# Patient Record
Sex: Female | Born: 1980 | Race: White | Hispanic: No | Marital: Single | Smoking: Never smoker
Health system: Southern US, Community
[De-identification: ages and names within clinical notes are randomized; demographics above are authoritative.]

## PROBLEM LIST (undated history)

## (undated) HISTORY — PX: APPENDECTOMY: SHX54

---

## 2005-01-26 ENCOUNTER — Ambulatory Visit: Payer: Self-pay | Admitting: Internal Medicine

## 2009-12-13 ENCOUNTER — Observation Stay: Payer: Self-pay | Admitting: Surgery

## 2009-12-13 ENCOUNTER — Ambulatory Visit: Payer: Self-pay | Admitting: Family Medicine

## 2015-03-17 ENCOUNTER — Encounter: Payer: Self-pay | Admitting: Emergency Medicine

## 2015-03-17 DIAGNOSIS — L03012 Cellulitis of left finger: Secondary | ICD-10-CM | POA: Diagnosis not present

## 2015-03-17 DIAGNOSIS — R2232 Localized swelling, mass and lump, left upper limb: Secondary | ICD-10-CM | POA: Diagnosis present

## 2015-03-17 DIAGNOSIS — L03111 Cellulitis of right axilla: Secondary | ICD-10-CM | POA: Diagnosis not present

## 2015-03-17 NOTE — ED Notes (Signed)
Pt presents to ER alert and in NAD. Pt states she has bilat third digit nail infections, pt states she is currently being treated for it x 5 days.

## 2015-03-18 ENCOUNTER — Emergency Department
Admission: EM | Admit: 2015-03-18 | Discharge: 2015-03-18 | Disposition: A | Discharge status: Home / Self Care | Payer: No Typology Code available for payment source | Attending: Emergency Medicine | Admitting: Emergency Medicine

## 2015-03-18 DIAGNOSIS — L03011 Cellulitis of right finger: Secondary | ICD-10-CM

## 2015-03-18 DIAGNOSIS — L03012 Cellulitis of left finger: Secondary | ICD-10-CM

## 2015-03-18 MED ORDER — CEPHALEXIN 500 MG PO CAPS
500.0000 mg | ORAL_CAPSULE | Freq: Once | ORAL | Status: AC
  Administered 2015-03-18: 500 mg via ORAL

## 2015-03-18 MED ORDER — CEPHALEXIN 500 MG PO CAPS
ORAL_CAPSULE | ORAL | Status: AC
  Administered 2015-03-18: 500 mg via ORAL
  Filled 2015-03-18: qty 1

## 2015-03-18 MED ORDER — CEPHALEXIN 500 MG PO CAPS: 500.0000 mg | ORAL_CAPSULE | Freq: Four times a day (QID) | ORAL | Status: DC

## 2015-03-18 NOTE — ED Notes (Signed)
Pt has redness, swelling and blister on her 3rd middle finger on both arm. Stated being Accutane for skin infection. Stated her PCP told her to come to ER for an antibiotic injection. Stated she is staring a new job in the morning.

## 2015-03-18 NOTE — ED Provider Notes (Signed)
[facility] Emergency Department Provider Note  ____________________________________________  Time seen: 1:15  I have reviewed the triage vital signs and the nursing notes.   HISTORY  Chief Complaint Nail Problem     HPI Rita Sandoval is a 34 y.o. female who presents with redness and swelling to the ulna side of both third finger nail beds.  She has spoken with her physician about this. The patient is on Accutane due to some issues with her back. They think the Accutane may be drying out her cuticles and making her more prone to paronychia. She has been using salt water warm soaks at home without much benefit. The pain is moderate to severe at times.  She does have a minor amount of discharge coming from the inflamed erythematous areas bilaterally.  Her primary physician sent her into the emergency department with the assessment ingestion of getting an antibiotic injection.  Patient is eager for a quick resolution of the symptoms as she returns to work tomorrow for the first time.     History reviewed. No pertinent past medical history.  There are no active problems to display for this patient.   Past Surgical History  Procedure Laterality Date  . Appendectomy      Current Outpatient Rx  Name  Route  Sig  Dispense  Refill  . cephALEXin (KEFLEX) 500 MG capsule   Oral   Take 1 capsule (500 mg total) by mouth 4 (four) times daily.   28 capsule   0     Allergies Monistat  History reviewed. No pertinent family history.  Social History History  Substance Use Topics  . Smoking status: Never Smoker   . Smokeless tobacco: Not on file  . Alcohol Use: Yes    Review of Systems  Constitutional: Negative for fever. ENT: Negative for sore throat. Cardiovascular: Negative for chest pain. Respiratory: Negative for shortness of breath. Gastrointestinal: Negative for abdominal pain, vomiting and diarrhea. Genitourinary: Negative for  dysuria. Musculoskeletal: Negative for back pain. Skin: Paronychia, bilateral third fingers. See history of present illness. Neurological: Negative for headaches   10-point ROS otherwise negative.  ____________________________________________   PHYSICAL EXAM:  VITAL SIGNS: ED Triage Vitals  Enc Vitals Group     BP 03/17/15 2257 141/76 mmHg     Pulse Rate 03/17/15 2257 58     Resp 03/17/15 2257 20     Temp 03/17/15 2257 98 F (36.7 C)     Temp Source 03/17/15 2257 Oral     SpO2 03/17/15 2257 99 %     Weight 03/17/15 2257 140 lb (63.504 kg)     Height 03/17/15 2257 5\' 2"  (1.575 m)     Head Cir --      Peak Flow --      Pain Score 03/17/15 2258 7     Pain Loc --      Pain Edu? --      Excl. in [place]? --     Constitutional:  Alert and oriented. Well appearing and in no distress. Cardiovascular: Normal rate, regular rhythm. Respiratory: Normal respiratory effort without tachypnea. Breath sounds are clear and equal bilaterally. No wheezes/rales/rhonchi. Gastrointestinal: Soft and nontender. No distention.  Back: No muscle spasm, no tenderness, no CVA tenderness. Musculoskeletal: Nontender with normal range of motion in all extremities.  No noted edema. Neurologic:  Normal speech and language. No gross focal neurologic deficits are appreciated.  Skin:  Redness and swelling with minor discharge from the ulnar, medial, edges  of the nailbeds on both #3 fingers. The discharge is minimal with some crustiness. There is no active discharge on palpation or examination. There is no erythema extending away from the nailbed. The remainder of the fingers or hands are uninvolved.  Psychiatric: Mood and affect are normal. Speech and behavior are normal.  ____________________________________________     INITIAL IMPRESSION / ASSESSMENT AND PLAN / ED COURSE  The patient has moderate though routine paronychia on both #3 fingers. I do not see any indication for IV or IM antibiotics. She has not  any oral antibiotics at the moment. She is not allergic to any medications. She is not breast-feeding currently. I am going to prescribe Keflex for her. She will continue with her warm water salt he soaks and allow any discharge as tolerated. She has an appointment to see her dermatologist in 5 days. Acting this is a good follow-up plan both in terms of specialty as well as in timing.  ____________________________________________   FINAL CLINICAL IMPRESSION(S) / ED DIAGNOSES  Final diagnoses:  Paronychia of finger, left  Paronychia of finger of right hand      Ahmed Prima, MD 03/18/15 [phone]

## 2015-03-18 NOTE — Discharge Instructions (Signed)

## 2019-01-11 ENCOUNTER — Telehealth: Payer: Self-pay | Admitting: Occupational Therapy

## 2019-01-11 NOTE — Telephone Encounter (Signed)
Documentation error.  No telephone call placed.

## 2019-09-05 ENCOUNTER — Other Ambulatory Visit: Payer: Self-pay

## 2019-09-05 DIAGNOSIS — Z20822 Contact with and (suspected) exposure to covid-19: Secondary | ICD-10-CM

## 2019-09-06 LAB — NOVEL CORONAVIRUS, NAA: SARS-CoV-2, NAA: DETECTED — AB

## 2020-05-30 ENCOUNTER — Ambulatory Visit: Payer: No Typology Code available for payment source | Admitting: Dermatology

## 2020-07-16 ENCOUNTER — Other Ambulatory Visit: Payer: Self-pay | Admitting: Internal Medicine

## 2020-07-16 DIAGNOSIS — Z1231 Encounter for screening mammogram for malignant neoplasm of breast: Secondary | ICD-10-CM

## 2020-08-29 ENCOUNTER — Other Ambulatory Visit: Payer: Self-pay

## 2020-08-29 ENCOUNTER — Ambulatory Visit
Admission: RE | Admit: 2020-08-29 | Discharge: 2020-08-29 | Disposition: A | Discharge status: Home / Self Care | Payer: 59 | Source: Ambulatory Visit | Attending: Internal Medicine | Admitting: Internal Medicine

## 2020-08-29 DIAGNOSIS — Z1231 Encounter for screening mammogram for malignant neoplasm of breast: Secondary | ICD-10-CM | POA: Diagnosis not present

## 2020-12-20 IMAGING — MG DIGITAL SCREENING BILAT W/ TOMO W/ CAD
8 series · 9 of 24 positions shown · non-contrast
Comparison: None.

CLINICAL DATA: Screening.

EXAM:
DIGITAL SCREENING BILATERAL MAMMOGRAM WITH TOMO AND CAD

[L MLO synth-2D]
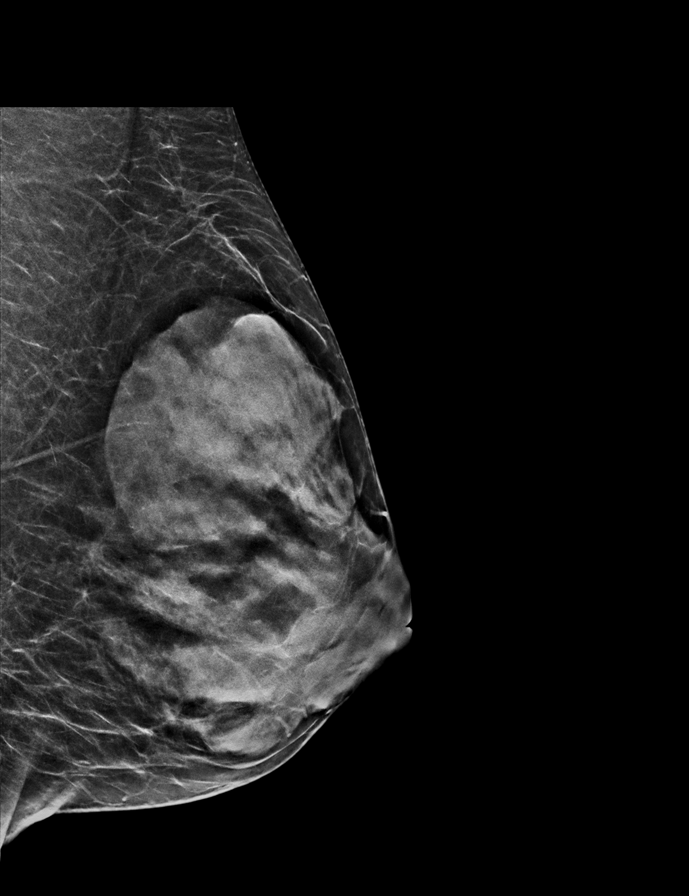

[L CC synth-2D]
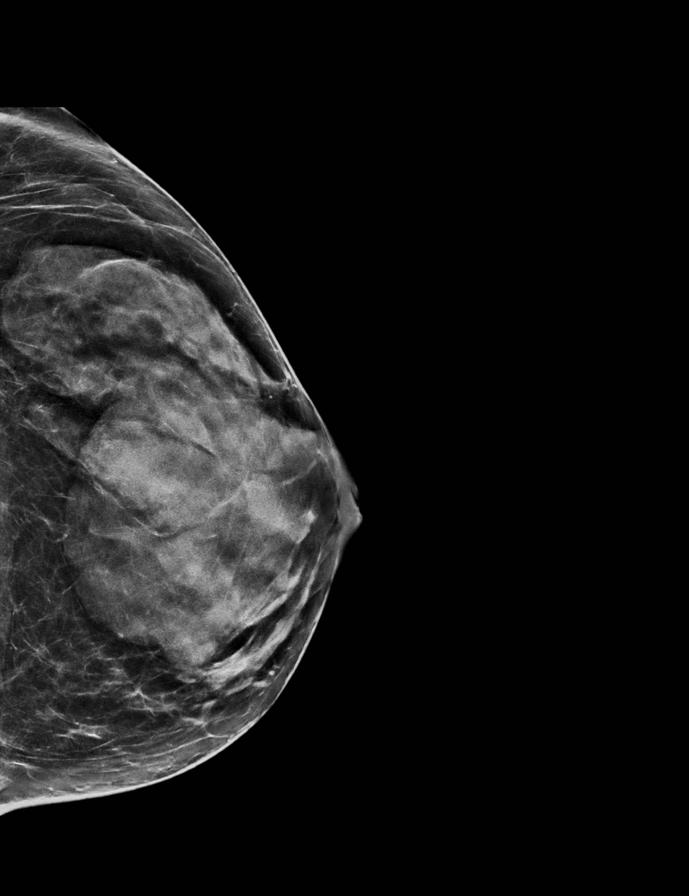

[R MLO synth-2D]
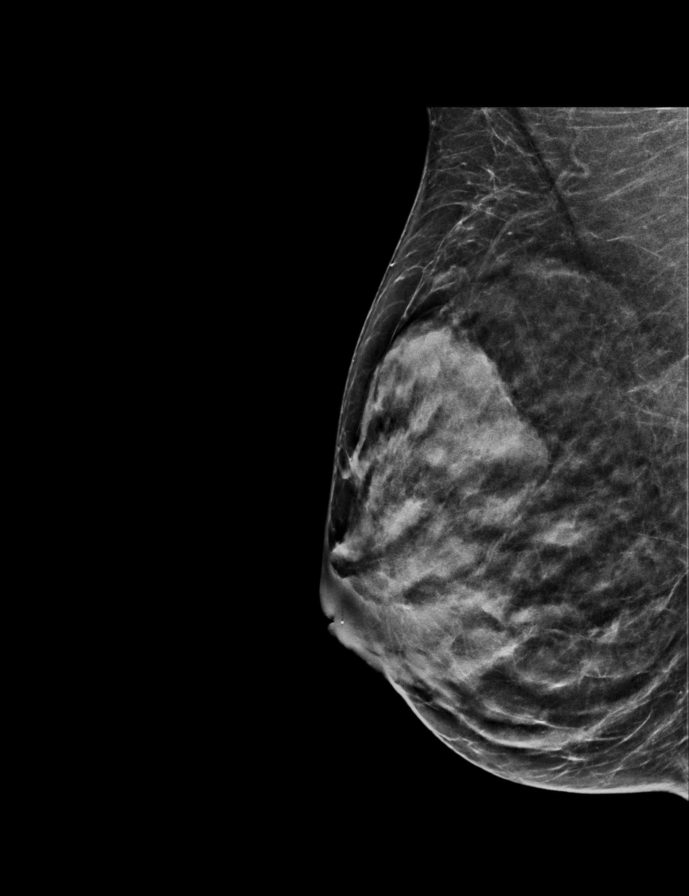

[R CC synth-2D]
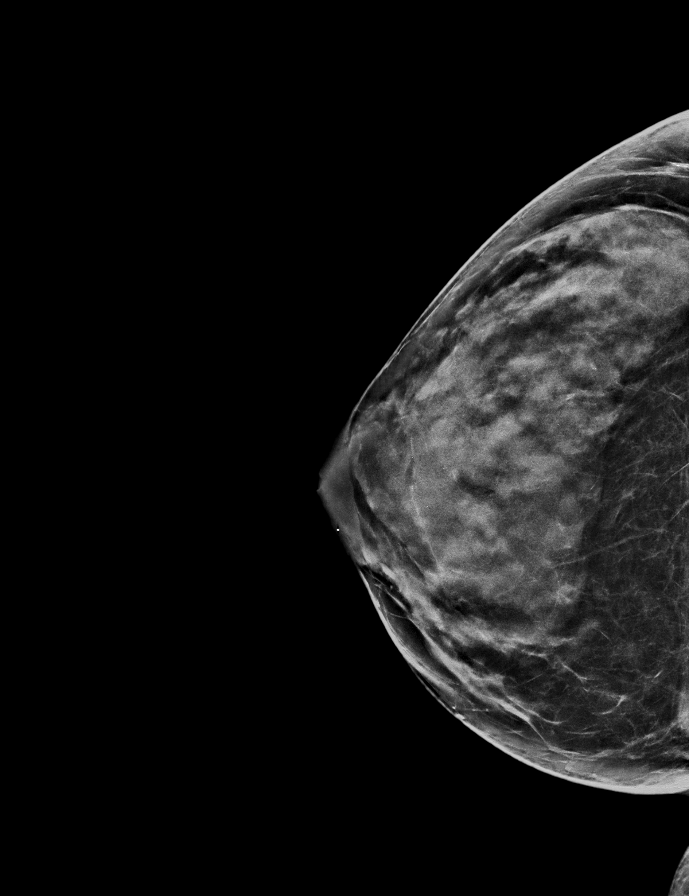

[L MLO tomo · 2 of 52 frames shown]
[frame 17/52]
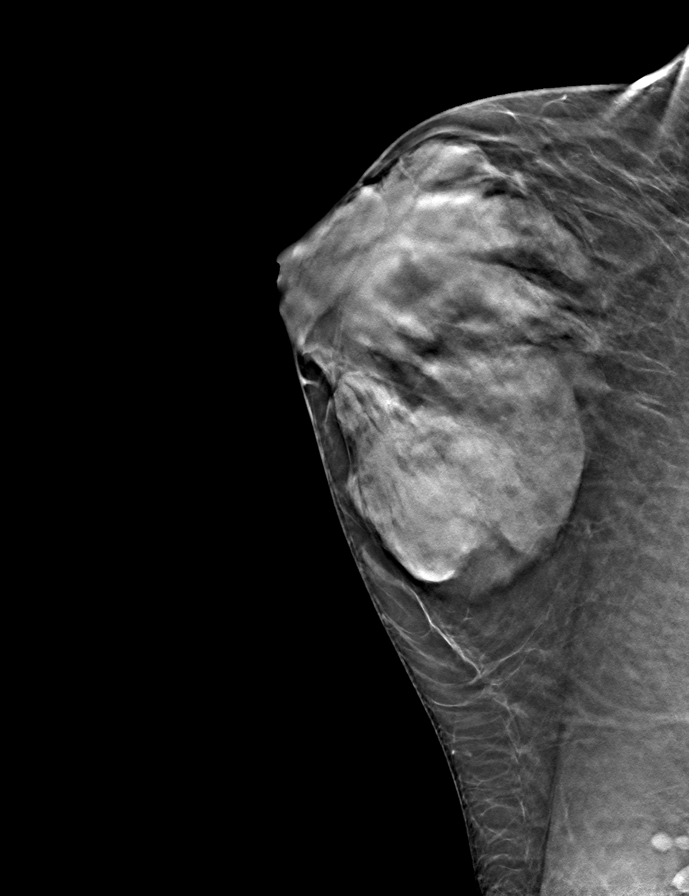
[frame 27/52]
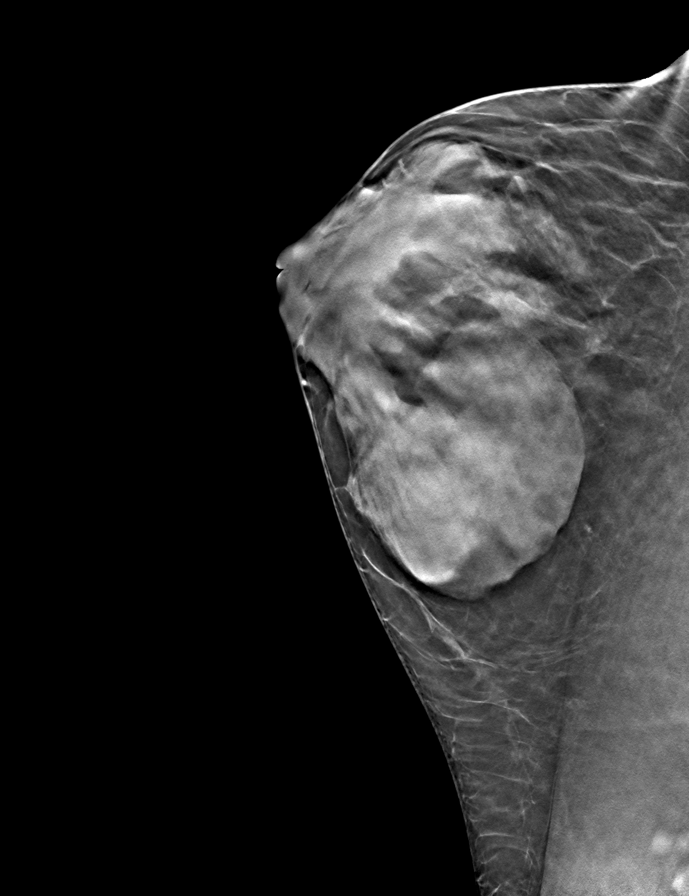

[R CC tomo · tomo slice 30/59.0]
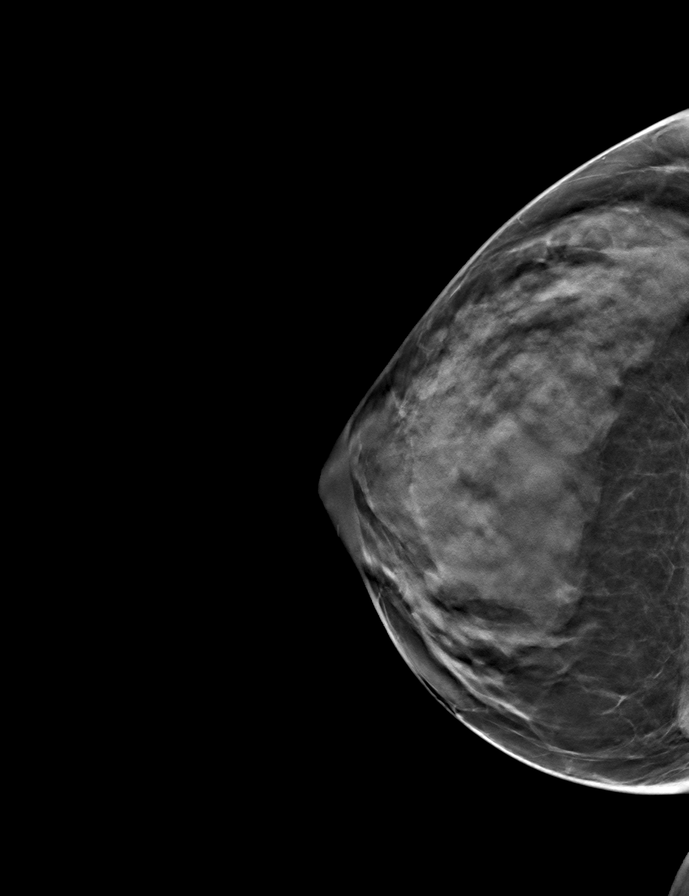

[R MLO tomo · tomo slice 27/54.0]
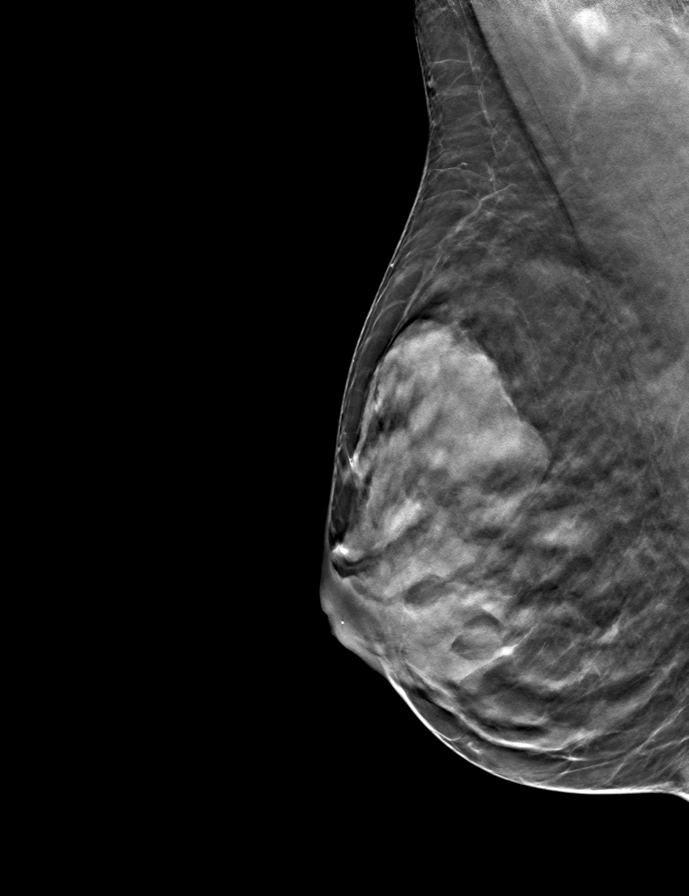

[L CC tomo · tomo slice 27/54.0]
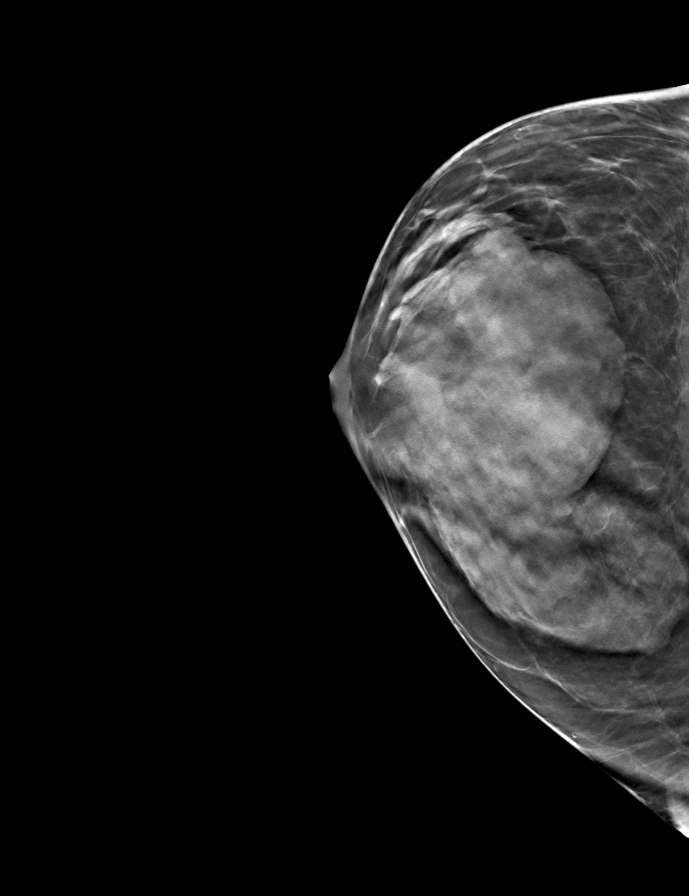

[9 of 24 positions shown; findings below may reference images not displayed]

ACR Breast Density Category d: The breast tissue is extremely dense,
which lowers the sensitivity of mammography.
FINDINGS: There are no findings suspicious for malignancy. Images were
processed with CAD.
IMPRESSION: No mammographic evidence of malignancy. A result letter of this
screening mammogram will be mailed directly to the patient.

RECOMMENDATION:
Screening mammogram in one year. (Code:F8-6-TBV)

BI-RADS CATEGORY  1: Negative.

## 2021-10-02 ENCOUNTER — Other Ambulatory Visit: Payer: Self-pay | Admitting: Internal Medicine

## 2021-10-02 DIAGNOSIS — Z1231 Encounter for screening mammogram for malignant neoplasm of breast: Secondary | ICD-10-CM

## 2021-10-10 ENCOUNTER — Other Ambulatory Visit: Payer: Self-pay

## 2021-10-10 ENCOUNTER — Ambulatory Visit
Admission: RE | Admit: 2021-10-10 | Discharge: 2021-10-10 | Disposition: A | Discharge status: Home / Self Care | Payer: BC Managed Care – PPO | Source: Ambulatory Visit | Attending: Internal Medicine | Admitting: Internal Medicine

## 2021-10-10 DIAGNOSIS — Z1231 Encounter for screening mammogram for malignant neoplasm of breast: Secondary | ICD-10-CM | POA: Diagnosis not present

## 2022-02-04 ENCOUNTER — Encounter: Payer: Self-pay | Admitting: Dermatology

## 2022-02-04 ENCOUNTER — Ambulatory Visit (INDEPENDENT_AMBULATORY_CARE_PROVIDER_SITE_OTHER): Payer: BC Managed Care – PPO | Admitting: Dermatology

## 2022-02-04 DIAGNOSIS — D1800 Hemangioma unspecified site: Secondary | ICD-10-CM

## 2022-02-04 DIAGNOSIS — D225 Melanocytic nevi of trunk: Secondary | ICD-10-CM

## 2022-02-04 DIAGNOSIS — D2222 Melanocytic nevi of left ear and external auricular canal: Secondary | ICD-10-CM

## 2022-02-04 DIAGNOSIS — L821 Other seborrheic keratosis: Secondary | ICD-10-CM

## 2022-02-04 DIAGNOSIS — L858 Other specified epidermal thickening: Secondary | ICD-10-CM

## 2022-02-04 DIAGNOSIS — D2239 Melanocytic nevi of other parts of face: Secondary | ICD-10-CM

## 2022-02-04 DIAGNOSIS — L708 Other acne: Secondary | ICD-10-CM | POA: Diagnosis not present

## 2022-02-04 DIAGNOSIS — D2261 Melanocytic nevi of right upper limb, including shoulder: Secondary | ICD-10-CM

## 2022-02-04 DIAGNOSIS — D229 Melanocytic nevi, unspecified: Secondary | ICD-10-CM

## 2022-02-04 DIAGNOSIS — L578 Other skin changes due to chronic exposure to nonionizing radiation: Secondary | ICD-10-CM

## 2022-02-04 DIAGNOSIS — Z1283 Encounter for screening for malignant neoplasm of skin: Secondary | ICD-10-CM

## 2022-02-04 DIAGNOSIS — L650 Telogen effluvium: Secondary | ICD-10-CM

## 2022-02-04 DIAGNOSIS — L814 Other melanin hyperpigmentation: Secondary | ICD-10-CM

## 2022-02-04 DIAGNOSIS — Z8619 Personal history of other infectious and parasitic diseases: Secondary | ICD-10-CM

## 2022-02-04 DIAGNOSIS — L811 Chloasma: Secondary | ICD-10-CM

## 2022-02-04 MED ORDER — FINACEA 15 % EX FOAM: CUTANEOUS | 2 refills | Status: DC

## 2022-02-04 MED ORDER — SPIRONOLACTONE 100 MG PO TABS: 100.0000 mg | ORAL_TABLET | Freq: Every day | ORAL | 2 refills | Status: DC

## 2022-02-04 NOTE — Progress Notes (Signed)
? ?New Patient Visit ? ?Subjective  ?Rita Sandoval is a 41 y.o. female who presents for the following: Annual Exam (Skin cancer screening. Full body. No personal hx of skin cancer or dysplastic nevi. Family hx of BCC and dysplastic nevi (mother)), Alopecia (Frontal scalp. Hx of shingles 08/2021 after having flu and sinus infection. PCP recommended using Hers Minoxidil topical shampoo and conditioner. Patient reports increasing hair loss. Recent labs showed low normal thyroid. Hx of strep infection 2-3 weeks ago), and Acne (Face and back. Has had Isotretinoin therapy. Has take Spironolactone in the past. Is currently using topical Tretinoin few nights per week). ? ?The patient presents for Total-Body Skin Exam (TBSE) for skin cancer screening and mole check.  The patient has spots, moles and lesions to be evaluated, some may be new or changing and the patient has concerns that these could be cancer. ? ? ?Review of Systems: No other skin or systemic complaints except as noted in HPI or Assessment and Plan. ? ? ?Objective  ?Well appearing patient in no apparent distress; mood and affect are within normal limits. ? ?A full examination was performed including scalp, head, eyes, ears, nose, lips, neck, chest, axillae, abdomen, back, buttocks, bilateral upper extremities, bilateral lower extremities, hands, feet, fingers, toes, fingernails, and toenails. All findings within normal limits unless otherwise noted below. ? ?Left Upper Helix, Right Inferior buttock, Right Lateral Canthus, Right Medial Breast, Right Upper Arm - Anterior ?3 mm tan papule at right lateral canthus ? ?5 mm gray brown regular papule left upper helix. Bx at next visit to R/O dysplasia. ? ?3.5 mm brown papule at right upper arm ? ?4 mm medium brown macule at right medial breast ? ?4 mm brown papule at right inferior buttock ? ? ? ? ? ? ?upper back, upper arms ?Tiny follicular keratotic papules.  ? ?Mid Back, face ?Closed comedones at back.  Inflammatory papule at right upper lip ? ?Scalp ?Diffuse thinning of hair, normal hair pull test.  ? ?face- forehead, cheeks ?Reticulated hyperpigmented patches.  ? ? ?Assessment & Plan  ? ?Lentigines ?- Scattered tan macules ?- Due to sun exposure ?- Benign-appearing, observe ?- Recommend daily broad spectrum sunscreen SPF 30+ to sun-exposed areas, reapply every 2 hours as needed. ?- Call for any changes ? ?Seborrheic Keratoses ?- Stuck-on, waxy, tan-brown papules and/or plaques  ?- Benign-appearing ?- Discussed benign etiology and prognosis. ?- Observe ?- Call for any changes ? ?Melanocytic Nevi. Chest, back, face ?- Tan-brown and/or pink-flesh-colored symmetric macules and papules ?- Benign appearing on exam today ?- Observation ?- Call clinic for new or changing moles ?- Recommend daily use of broad spectrum spf 30+ sunscreen to sun-exposed areas.  ? ?Hemangiomas ?- Red papules ?- Discussed benign nature ?- Observe ?- Call for any changes ? ?Actinic Damage ?- Chronic condition, secondary to cumulative UV/sun exposure ?- diffuse scaly erythematous macules with underlying dyspigmentation ?- Recommend daily broad spectrum sunscreen SPF 30+ to sun-exposed areas, reapply every 2 hours as needed.  ?- Staying in the shade or wearing long sleeves, sun glasses (UVA+UVB protection) and wide brim hats (4-inch brim around the entire circumference of the hat) are also recommended for sun protection.  ?- Call for new or changing lesions. ? ?Skin cancer screening performed today. ? ?Nevus (5) ?Right Inferior buttock; Right Upper Arm - Anterior; Right Medial Breast; Right Lateral Canthus; Left Upper Helix ? ?Benign-appearing.  Observation.  Call clinic for new or changing lesions.  Recommend daily use of broad spectrum  spf 30+ sunscreen to sun-exposed areas.   ? ?RTC for shave removal if desired. Right arm and face. ? ?Discussed resulting small scar with shave removal, and possible recurrence of lesion.   ? 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 ?Chronic and persistent condition with duration or expected duration over one year. Condition is symptomatic / bothersome to patient. Not to goal. ? ? ?Recommend starting moisturizer with exfoliant (Urea, Salicylic acid, or Lactic acid) one to two times daily to help smooth rough and bumpy skin.  OTC options include Cetaphil Rough and Bumpy lotion (Urea), Eucerin Roughness Relief lotion or spot treatment cream (Urea), CeraVe SA lotion/cream for Rough and Bumpy skin (Sal Acid), Gold Bond Rough and Bumpy cream (Sal Acid), and AmLactin 12% lotion/cream (Lactic Acid).  If applying in morning, also apply sunscreen to sun-exposed areas, since these exfoliating moisturizers can increase sensitivity to sun.  ? ?Other acne ?Mid Back, face ? ?Chronic and persistent condition with duration or expected duration over one year. Condition is symptomatic / bothersome to patient. Not to goal. ? ?Start Spironolactone '100mg'$  1/2 tablet for first 1-2 weeks, increase to 1 tablet daily as tolerated.  ? ?Start Finacea foam once or twice daily to face for acne.  ? ?Continue tretinoin 0.1% cream qhs to face, back as tolerated. ? ?Spironolactone can cause increased urination and cause blood pressure to decrease. Please watch for signs of lightheadedness and be cautious when changing position. It can sometimes cause breast tenderness or an irregular period in premenopausal women. It can also increase potassium. The increase in potassium usually is not a concern unless you are taking other medicines that also increase potassium, so please be sure your doctor knows all of the other medications you are taking. This medication should not be taken by pregnant women.  This medicine should also not be taken together with sulfa drugs like Bactrim (trimethoprim/sulfamethexazole).  ? ? ? ? ?spironolactone (ALDACTONE) 100 MG tablet - Mid Back, face ?Take 1 tablet (100 mg total) by mouth daily. ? ?Azelaic Acid (FINACEA)  15 % FOAM - Mid Back, face ?Apply once or twice daily to face ? 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 ?Telogen effluvium is a benign, self-limited condition causing increased hair shedding usually for several months. It does not progress to baldness, and the hair eventually grows back on its own. It can be triggered by recent illness, recent surgery, thyroid disease, low iron stores, vitamin D deficiency, fad diets or rapid weight loss, hormonal changes such as pregnancy or birth control pills, and some medication. Usually the hair loss starts 2-3 months after the illness or health change. Rarely, it can continue for longer than a year. ? ?Recent labs from PCP reviewed at visit today. Low Ferritin at 12, would like it to be at 50 or higher.  Pt doesn't eat meat. ? ?Recommend taking iron supplement. Iron sulfate 325 mg, take with vitamin C or glass of orange juice every other day. Avoid taking with milk products. ? ?Discussed increasing topical minoxidil to 5%.  ?Discussed adding oral Minoxidil, patient defers for now.  ? ?Melasma ?face- forehead, cheeks ? ?Melasma is a chronic; persistent condition of hyperpigmented patches generally on the face, worse in summer due to higher UV exposure.    ?Heredity; thyroid disease; sun exposure; pregnancy; birth control pills; epilepsy medication and darker skin may predispose to Melasma.   ?Recommendations include: ?- Sun avoidance and daily broad spectrum (UVA/UVB) sunscreen SPF 30+, preferably with Zinc or Titanium Dioxide. ?- Rx  topical bleaching creams (i.e. hydroquinone) is a common treatment but should not be used long term.  Hydroquinones may be mixed with retinoids; steroids; Kojic Acid. ?- Rx Azelaic Acid is also a treatment option that is safe for pregnancy (Category B). ?- OTC Heliocare can be helpful in control and prevention. ?- Oral Rx with Tranexamic Acid 250 mg - 650 mg po daily can be used for moderate to severe cases especially during summer (contraindications include  pregnancy; lactation; hx of PE; DVT; clotting disorder; heart disease; anticoagulant use and upcoming long trips)   ?- Chemical peels (would need multiple for best result).  ?- Lasers and  Microdermabrasion may also be

## 2022-02-04 NOTE — Patient Instructions (Addendum)
Acne: ?Start Spironolactone '100mg'$  1/2 tablet for first 3-4 days, increase to 1 tablet daily as tolerated.  ? ?Spironolactone can cause increased urination and cause blood pressure to decrease. Please watch for signs of lightheadedness and be cautious when changing position. It can sometimes cause breast tenderness or an irregular period in premenopausal women. It can also increase potassium. The increase in potassium usually is not a concern unless you are taking other medicines that also increase potassium, so please be sure your doctor knows all of the other medications you are taking. This medication should not be taken by pregnant women.  This medicine should also not be taken together with sulfa drugs like Bactrim (trimethoprim/sulfamethexazole).  ? ? ? ?Hair Thinning: ?Recommend taking iron supplement. Iron 325 mg, take with vitamin C or glass of orange juice every other day. Increase iron in diet.  ? ?Can use Minoxidil 5% solution or foam to scalp daily.  ? ?Melasma is a chronic; persistent condition of hyperpigmented patches generally on the face, worse in summer due to higher UV exposure.    ?Heredity; thyroid disease; sun exposure; pregnancy; birth control pills; epilepsy medication and darker skin may predispose to Melasma.   ?Recommendations include: ?- Sun avoidance and daily broad spectrum (UVA/UVB) sunscreen SPF 30+, preferably with Zinc or Titanium Dioxide. ?- Rx topical bleaching creams (i.e. hydroquinone) is a common treatment but should not be used long term.  Hydroquinones may be mixed with retinoids; steroids; Kojic Acid. ?- Rx Azelaic Acid is also a treatment option that is safe for pregnancy (Category B). ?- OTC Heliocare can be helpful in control and prevention. ?- Oral Rx with Tranexamic Acid 250 mg - 650 mg po daily can be used for moderate to severe cases especially during summer (contraindications include pregnancy; lactation; hx of PE; DVT; clotting disorder; heart disease; anticoagulant  use and upcoming long trips)   ?- Chemical peels (would need multiple for best result).  ?- Lasers and  Microdermabrasion may also be helpful adjunct treatments. ? ?[address] with Hydroquinone, continue as directed. ? ? ?Melanoma ABCDEs ? ?Melanoma is the most dangerous type of skin cancer, and is the leading cause of death from skin disease.  You are more likely to develop melanoma if you: ?Have light-colored skin, light-colored eyes, or red or blond hair ?Spend a lot of time in the sun ?Tan regularly, either outdoors or in a tanning bed ?Have had blistering sunburns, especially during childhood ?Have a close family member who has had a melanoma ?Have atypical moles or large birthmarks ? ?Early detection of melanoma is key since treatment is typically straightforward and cure rates are extremely high if we catch it early.  ? ?The first sign of melanoma is often a change in a mole or a new dark spot.  The ABCDE system is a way of remembering the signs of melanoma. ? ?A for asymmetry:  The two halves do not match. ?B for border:  The edges of the growth are irregular. ?C for color:  A mixture of colors are present instead of an even brown color. ?D for diameter:  Melanomas are usually (but not always) greater than 43m - the size of a pencil eraser. ?E for evolution:  The spot keeps changing in size, shape, and color. ? ?Please check your skin once per month between visits. You can use a small mirror in front and a large mirror behind you to keep an eye on the back side or your body.  ? ?If  you see any new or changing lesions before your next follow-up, please call to schedule a visit. ? ?Please continue daily skin protection including broad spectrum sunscreen SPF 30+ to sun-exposed areas, reapplying every 2 hours as needed when you're outdoors.  ? ?Staying in the shade or wearing long sleeves, sun glasses (UVA+UVB protection) and wide brim hats (4-inch brim around the entire circumference of the hat) are also  recommended for sun protection.   ? ? ?If You Need Anything After Your Visit ? ?If you have any questions or concerns for your doctor, please call our main line at [phone] and press option 4 to reach your doctor's medical assistant. If no one answers, please leave a voicemail as directed and we will return your call as soon as possible. Messages left after 4 pm will be answered the following business day.  ? ?You may also send [place] a message via MyChart. We typically respond to MyChart messages within 1-2 business days. ? ?For prescription refills, please ask your pharmacy to contact our office. Our fax number is [phone]. ? ?If you have an urgent issue when the clinic is closed that cannot wait until the next business day, you can page your doctor at the number below.   ? ?Please note that while we do our best to be available for urgent issues outside of office hours, we are not available 24/7.  ? ?If you have an urgent issue and are unable to reach [place], you may choose to seek medical care at your doctor's office, retail clinic, urgent care center, or emergency room. ? ?If you have a medical emergency, please immediately call 911 or go to the emergency department. ? ?Pager Numbers ? ?- Dr. Nehemiah Massed: [phone] ? ?- Dr. Laurence Ferrari: [phone] ? ?- Dr. Nicole Kindred: [phone] ? ?In the event of inclement weather, please call our main line at [phone] for an update on the status of any delays or closures. ? ?Dermatology Medication Tips: ?Please keep the boxes that topical medications come in in order to help keep track of the instructions about where and how to use these. Pharmacies typically print the medication instructions only on the boxes and not directly on the medication tubes.  ? ?If your medication is too expensive, please contact our office at [phone] option 4 or send [place] a message through [place].  ? ?We are unable to tell what your co-pay for medications will be in advance as this is different  depending on your insurance coverage. However, we may be able to find a substitute medication at lower cost or fill out paperwork to get insurance to cover a needed medication.  ? ?If a prior authorization is required to get your medication covered by your insurance company, please allow [place] 1-2 business days to complete this process. ? ?Drug prices often vary depending on where the prescription is filled and some pharmacies may offer cheaper prices. ? ?The website www.goodrx.com contains coupons for medications through different pharmacies. The prices here do not account for what the cost may be with help from insurance (it may be cheaper with your insurance), but the website can give you the price if you did not use any insurance.  ?- You can print the associated coupon and take it with your prescription to the pharmacy.  ?- You may also stop by our office during regular business hours and pick up a GoodRx coupon card.  ?- If you need your prescription sent electronically to a different pharmacy, notify our office  through [facility] or by phone at [phone] option 4. ? ? ? ? ?Si Usted Necesita Algo Despu?s de Su Visita ? ?Tambi?n puede enviarnos un mensaje a trav?s de MyChart. Por lo general respondemos a los mensajes de MyChart en el transcurso de 1 a 2 d?as h?biles. ? ?Para renovar recetas, por favor pida a su farmacia que se ponga en contacto con nuestra oficina. Nuestro n?mero de fax es el [phone]. ? ?Si tiene un asunto urgente cuando la cl?nica est? cerrada y que no puede esperar hasta el siguiente d?a h?bil, puede llamar/localizar a su doctor(a) al n?mero que aparece a continuaci?n.  ? ?Por favor, tenga en cuenta que aunque hacemos todo lo posible para estar disponibles para asuntos urgentes fuera del horario de oficina, no estamos disponibles las 24 horas del d?a, los 7 d?as de la semana.  ? ?Si tiene un problema urgente y no puede comunicarse con nosotros, puede optar por buscar atenci?n  m?dica  en el consultorio de su doctor(a), en una cl?nica privada, en un centro de atenci?n urgente o en una sala de emergencias. ? ?Si tiene una emergencia m?dica, por favor llame inmediatamente al 911 o vay

## 2022-03-02 ENCOUNTER — Ambulatory Visit: Payer: No Typology Code available for payment source | Admitting: Dermatology

## 2022-03-18 ENCOUNTER — Ambulatory Visit (INDEPENDENT_AMBULATORY_CARE_PROVIDER_SITE_OTHER): Payer: BC Managed Care – PPO | Admitting: Dermatology

## 2022-03-18 DIAGNOSIS — D22112 Melanocytic nevi of right lower eyelid, including canthus: Secondary | ICD-10-CM

## 2022-03-18 DIAGNOSIS — L650 Telogen effluvium: Secondary | ICD-10-CM | POA: Diagnosis not present

## 2022-03-18 DIAGNOSIS — L700 Acne vulgaris: Secondary | ICD-10-CM | POA: Diagnosis not present

## 2022-03-18 DIAGNOSIS — L811 Chloasma: Secondary | ICD-10-CM | POA: Diagnosis not present

## 2022-03-18 DIAGNOSIS — D224 Melanocytic nevi of scalp and neck: Secondary | ICD-10-CM

## 2022-03-18 DIAGNOSIS — D2222 Melanocytic nevi of left ear and external auricular canal: Secondary | ICD-10-CM | POA: Diagnosis not present

## 2022-03-18 DIAGNOSIS — D2261 Melanocytic nevi of right upper limb, including shoulder: Secondary | ICD-10-CM

## 2022-03-18 DIAGNOSIS — D485 Neoplasm of uncertain behavior of skin: Secondary | ICD-10-CM

## 2022-03-18 MED ORDER — AZELAIC ACID 15 % EX GEL: CUTANEOUS | 3 refills | Status: DC

## 2022-03-18 NOTE — Progress Notes (Signed)
Follow-Up Visit   Subjective  Rita Sandoval is a 41 y.o. female who presents for the following: Follow-up.  Patient here for 1 month follow-up. She is using Spironolactone '100mg'$  daily and tretinoin 0.1% cream, Finacea foam not covered by insurance. She also has a nevus to biopsy on the left upper ear helix to r/o dysplasia and irritated nevi to remove.  The following portions of the chart were reviewed this encounter and updated as appropriate:       Review of Systems:  No other skin or systemic complaints except as noted in HPI or Assessment and Plan.  Objective  Well appearing patient in no apparent distress; mood and affect are within normal limits.  A focused examination was performed including face, neck, arms. Relevant physical exam findings are noted in the Assessment and Plan.  L upper ear helix 5 mm gray brown regular papule       Right Lateral Canthus 3.0 mm tan papule  Right Neck 4.0 mm tan papule  Right Upper Arm 3.5 mm brown papule  face Reticulated hyperpigmented patches.   face, back Inflammatory papule left cheek  Scalp Diffuse thinning of hair    Assessment & Plan  Neoplasm of uncertain behavior of skin (4) L upper ear helix  Epidermal / dermal shaving  Lesion diameter (cm):  0.6 Informed consent: discussed and consent obtained   Patient was prepped and draped in usual sterile fashion: Area prepped with alcohol. Anesthesia: the lesion was anesthetized in a standard fashion   Anesthetic:  1% lidocaine w/ epinephrine 1-100,000 buffered w/ 8.4% NaHCO3 Instrument used: flexible razor blade   Hemostasis achieved with: pressure, aluminum chloride and electrodesiccation   Outcome: patient tolerated procedure well   Post-procedure details: wound care instructions given   Post-procedure details comment:  Ointment and small bandage applied  Specimen 1 - Surgical pathology Differential Diagnosis: Nevus r/o Dysplasia Check Margins: yes 5  mm gray brown regular papule  Right Lateral Canthus  Epidermal / dermal shaving  Lesion diameter (cm):  0.3 Informed consent: discussed and consent obtained   Patient was prepped and draped in usual sterile fashion: Area prepped with alcohol. Anesthesia: the lesion was anesthetized in a standard fashion   Anesthetic:  1% lidocaine w/ epinephrine 1-100,000 buffered w/ 8.4% NaHCO3 Instrument used: flexible razor blade   Hemostasis achieved with: pressure, aluminum chloride and electrodesiccation   Outcome: patient tolerated procedure well   Post-procedure details: wound care instructions given   Post-procedure details comment:  Ointment and small bandage applied  Specimen 2 - Surgical pathology Differential Diagnosis: Irritated Nevus vs other Check Margins: No 3.0 mm tan papule  Right Neck  Epidermal / dermal shaving  Lesion diameter (cm):  0.4 Informed consent: discussed and consent obtained   Patient was prepped and draped in usual sterile fashion: Area prepped with alcohol. Anesthesia: the lesion was anesthetized in a standard fashion   Anesthetic:  1% lidocaine w/ epinephrine 1-100,000 buffered w/ 8.4% NaHCO3 Instrument used: flexible razor blade   Hemostasis achieved with: pressure, aluminum chloride and electrodesiccation   Outcome: patient tolerated procedure well   Post-procedure details: wound care instructions given   Post-procedure details comment:  Ointment and small bandage applied  Specimen 3 - Surgical pathology Differential Diagnosis: Irritated nevus vs other Check Margins: No 4.0 mm tan papule  Right Upper Arm  Epidermal / dermal shaving  Lesion diameter (cm):  0.4 Informed consent: discussed and consent obtained   Patient was prepped and draped in usual  sterile fashion: Area prepped with alcohol. Anesthesia: the lesion was anesthetized in a standard fashion   Anesthetic:  1% lidocaine w/ epinephrine 1-100,000 buffered w/ 8.4% NaHCO3 Instrument used:  flexible razor blade   Hemostasis achieved with: pressure, aluminum chloride and electrodesiccation   Outcome: patient tolerated procedure well   Post-procedure details: wound care instructions given   Post-procedure details comment:  Ointment and small bandage applied  Specimen 4 - Surgical pathology Differential Diagnosis: Irritated Nevus vs other Check Margins: No 3.5 mm brown papule  Discussed resulting small scar with shave removal, and possible recurrence of lesion.  Recommend vaseline ointment to area daily and cover until healed.  Recommend photoprotection/sunscreen to area to prevent discoloration of scar.  Once healed, may apply OTC Serica scar gel bid to thickened scars.   Melasma face  Melasma is a chronic; persistent condition of hyperpigmented patches generally on the face, worse in summer due to higher UV exposure.    Heredity; thyroid disease; sun exposure; pregnancy; birth control pills; epilepsy medication and darker skin may predispose to Melasma.   Recommendations include: - Sun avoidance and daily broad spectrum (UVA/UVB) sunscreen SPF 30+, preferably with Zinc or Titanium Dioxide. - Rx topical bleaching creams (i.e. hydroquinone) is a common treatment but should not be used long term.  Hydroquinones may be mixed with retinoids; steroids; Kojic Acid. - Rx Azelaic Acid is also a treatment option that is safe for pregnancy (Category B). - OTC Heliocare can be helpful in control and prevention. - Oral Rx with Tranexamic Acid 250 mg - 650 mg po daily can be used for moderate to severe cases especially during summer (contraindications include pregnancy; lactation; hx of PE; DVT; clotting disorder; heart disease; anticoagulant use and upcoming long trips)   - Chemical peels (would need multiple for best result).  - Lasers and  Microdermabrasion may also be helpful adjunct treatments.  Has Obagi Blend with Hydroquinone, continue as directed. Need break off medication after 3  months of use.     Acne vulgaris face, back  Chronic and persistent condition with duration or expected duration over one year. Condition is symptomatic / bothersome to patient. Not to goal.  Start Azelaic Acid 15% gel Apply to face QD/BID dsp 50g 3Rf Continue tretinoin 0.1% cream qhs face. Continue Spironolactone '100mg'$  take 1 po QD.   Spironolactone can cause increased urination and cause blood pressure to decrease. Please watch for signs of lightheadedness and be cautious when changing position. It can sometimes cause breast tenderness or an irregular period in premenopausal women. It can also increase potassium. The increase in potassium usually is not a concern unless you are taking other medicines that also increase potassium, so please be sure your doctor knows all of the other medications you are taking. This medication should not be taken by pregnant women.  This medicine should also not be taken together with sulfa drugs like Bactrim (trimethoprim/sulfamethexazole).   Topical retinoid medications like tretinoin/Retin-A, adapalene/Differin, tazarotene/Fabior, and Epiduo/Epiduo Forte can cause dryness and irritation when first started. Only apply a pea-sized amount to the entire affected area. Avoid applying it around the eyes, edges of mouth and creases at the nose. If you experience irritation, use a good moisturizer first and/or apply the medicine less often. If you are doing well with the medicine, you can increase how often you use it until you are applying every night. Be careful with sun protection while using this medication as it can make you sensitive to the sun. This  medicine should not be used by pregnant women.    Azelaic Acid 15 % gel - face, back After skin is thoroughly washed and patted dry, gently but thoroughly massage a thin film of azelaic acid cream into the affected area twice daily, in the morning and evening.  Telogen effluvium Scalp  Telogen effluvium is a  benign, self-limited condition causing increased hair shedding usually for several months. It does not progress to baldness, and the hair eventually grows back on its own. It can be triggered by recent illness, recent surgery, thyroid disease, low iron stores, vitamin D deficiency, fad diets or rapid weight loss, hormonal changes such as pregnancy or birth control pills, and some medication. Usually the hair loss starts 2-3 months after the illness or health change. Rarely, it can continue for longer than a year.  Continue taking iron supplement. Iron sulfate 325 mg, take with vitamin C or glass of orange juice every other day. Avoid taking with milk products.  Can take couple of months to see improvement, will recheck ferritin on f/up   Discussed increasing topical minoxidil to 5%.        Return in about 8 weeks (around 05/13/2022).  IJamesetta Orleans, CMA, am acting as scribe for Brendolyn Patty, MD . Documentation: I have reviewed the above documentation for accuracy and completeness, and I agree with the above.  Brendolyn Patty MD

## 2022-03-18 NOTE — Patient Instructions (Addendum)
Wound Care Instructions  Cleanse wound gently with soap and water once a day then pat dry with clean gauze. Apply a thing coat of Petrolatum (petroleum jelly, "Vaseline") over the wound (unless you have an allergy to this). We recommend that you use a new, sterile tube of Vaseline. Do not pick or remove scabs. Do not remove the yellow or white "healing tissue" from the base of the wound.  Cover the wound with fresh, clean, nonstick gauze and secure with paper tape. You may use Band-Aids in place of gauze and tape if the would is small enough, but would recommend trimming much of the tape off as there is often too much. Sometimes Band-Aids can irritate the skin.  You should call the office for your biopsy report after 1 week if you have not already been contacted.  If you experience any problems, such as abnormal amounts of bleeding, swelling, significant bruising, significant pain, or evidence of infection, please call the office immediately.  FOR ADULT SURGERY PATIENTS: If you need something for pain relief you may take 1 extra strength Tylenol (acetaminophen) AND 2 Ibuprofen ('200mg'$  each) together every 4 hours as needed for pain. (do not take these if you are allergic to them or if you have a reason you should not take them.) Typically, you may only need pain medication for 1 to 3 days.   Discussed resulting small scar with shave removal, and possible recurrence of lesion.  Recommend vaseline ointment to area daily and cover until healed.  Recommend photoprotection/sunscreen to area to prevent discoloration of scar.  Once healed, may apply OTC Serica scar gel bid to thickened scars.   Due to recent changes in healthcare laws, you may see results of your pathology and/or laboratory studies on MyChart before the doctors have had a chance to review them. We understand that in some cases there may be results that are confusing or concerning to you. Please understand that not all results are received at  the same time and often the doctors may need to interpret multiple results in order to provide you with the best plan of care or course of treatment. Therefore, we ask that you please give [place] 2 business days to thoroughly review all your results before contacting the office for clarification. Should we see a critical lab result, you will be contacted sooner.   If You Need Anything After Your Visit  If you have any questions or concerns for your doctor, please call our main line at [phone] and press option 4 to reach your doctor's medical assistant. If no one answers, please leave a voicemail as directed and we will return your call as soon as possible. Messages left after 4 pm will be answered the following business day.   You may also send [place] a message via [place]. We typically respond to MyChart messages within 1-2 business days.  For prescription refills, please ask your pharmacy to contact our office. Our fax number is [phone].  If you have an urgent issue when the clinic is closed that cannot wait until the next business day, you can page your doctor at the number below.    Please note that while we do our best to be available for urgent issues outside of office hours, we are not available 24/7.   If you have an urgent issue and are unable to reach [place], you may choose to seek medical care at your doctor's office, retail clinic, urgent care center, or emergency room.  If  you have a medical emergency, please immediately call 911 or go to the emergency department.  Pager Numbers  - Dr. Nehemiah Massed: [phone]  - Dr. Laurence Ferrari: [phone]  - Dr. Nicole Kindred: [phone]  In the event of inclement weather, please call our main line at [phone] for an update on the status of any delays or closures.  Dermatology Medication Tips: Please keep the boxes that topical medications come in in order to help keep track of the instructions about where and how to use these. Pharmacies  typically print the medication instructions only on the boxes and not directly on the medication tubes.   If your medication is too expensive, please contact our office at [phone] option 4 or send [place] a message through [place].   We are unable to tell what your co-pay for medications will be in advance as this is different depending on your insurance coverage. However, we may be able to find a substitute medication at lower cost or fill out paperwork to get insurance to cover a needed medication.   If a prior authorization is required to get your medication covered by your insurance company, please allow [place] 1-2 business days to complete this process.  Drug prices often vary depending on where the prescription is filled and some pharmacies may offer cheaper prices.  The website www.goodrx.com contains coupons for medications through different pharmacies. The prices here do not account for what the cost may be with help from insurance (it may be cheaper with your insurance), but the website can give you the price if you did not use any insurance.  - You can print the associated coupon and take it with your prescription to the pharmacy.  - You may also stop by our office during regular business hours and pick up a GoodRx coupon card.  - If you need your prescription sent electronically to a different pharmacy, notify our office through [facility] or by phone at [phone] option 4.     Si Usted Necesita Algo Despus de Su Visita  Tambin puede enviarnos un mensaje a travs de Pharmacist, community. Por lo general respondemos a los mensajes de MyChart en el transcurso de 1 a 2 das hbiles.  Para renovar recetas, por favor pida a su farmacia que se ponga en contacto con nuestra oficina. Harland Dingwall de fax es [place] [phone].  Si tiene un asunto urgente cuando la clnica est cerrada y que no puede esperar hasta el siguiente da hbil, puede llamar/localizar a su doctor(a) al nmero que  aparece a continuacin.   Por favor, tenga en cuenta que aunque hacemos todo lo posible para estar disponibles para asuntos urgentes fuera del horario de [place], no estamos disponibles las 24 horas del da, los 7 das de la [place].   Si tiene un problema urgente y no puede comunicarse con nosotros, puede optar por buscar atencin mdica  en el consultorio de su doctor(a), en una clnica privada, en un centro de atencin urgente o en una sala de emergencias.  Si tiene Engineering geologist, por favor llame inmediatamente al 911 o vaya a la sala de emergencias.  Nmeros de bper  - Dr. Nehemiah Massed: [phone]  - Dra. Moye: [phone]  - Dra. Nicole Kindred: [phone]  En caso de inclemencias del [place], por favor llame a Johnsie Kindred principal al [phone] para una actualizacin sobre el [place] de cualquier retraso o cierre.  Consejos para la medicacin en dermatologa: Por favor, guarde las cajas en las que vienen los medicamentos de [place]  tpico para ayudarle a seguir las instrucciones sobre dnde y cmo usarlos. Las farmacias generalmente imprimen las instrucciones del medicamento slo en las cajas y no directamente en los tubos del [place].   Si su medicamento es muy caro, por favor, pngase en contacto con Zigmund Daniel llamando al [phone] y presione la opcin 4 o envenos un mensaje a travs de Pharmacist, community.   No podemos decirle cul ser su copago por los medicamentos por adelantado ya que esto es diferente dependiendo de la cobertura de su seguro. Sin embargo, es posible que podamos encontrar un medicamento sustituto a Electrical engineer un formulario para que el seguro cubra el medicamento que se considera necesario.   Si se requiere una autorizacin previa para que su compaa de seguros [place] su medicamento, por favor permtanos de 1 a 2 das hbiles para completar este proceso.  Los precios de los medicamentos varan con frecuencia dependiendo del Environmental consultant de dnde se surte  la receta y alguna farmacias pueden ofrecer precios ms baratos.  El sitio web www.goodrx.com tiene cupones para medicamentos de Airline pilot. Los precios aqu no tienen en cuenta lo que podra costar con la ayuda del seguro (puede ser ms barato con su seguro), pero el sitio web puede darle el precio si no utiliz Research scientist (physical sciences).  - Puede imprimir el cupn correspondiente y llevarlo con su receta a la farmacia.  - Tambin puede pasar por nuestra oficina durante el horario de atencin regular y Charity fundraiser una tarjeta de cupones de GoodRx.  - Si necesita que su receta se enve electrnicamente a una farmacia diferente, informe a nuestra oficina a travs de MyChart de [place] o por telfono llamando al [phone] y presione la opcin 4.

## 2022-03-23 ENCOUNTER — Telehealth: Payer: Self-pay

## 2022-03-23 NOTE — Telephone Encounter (Signed)
LM on VM please return my call  

## 2022-03-25 ENCOUNTER — Telehealth: Payer: Self-pay

## 2022-03-25 NOTE — Telephone Encounter (Signed)
Left pt msg to call for bx results/sh 

## 2022-03-25 NOTE — Telephone Encounter (Signed)
-----   Message from Brendolyn Patty, MD sent at 03/23/2022  2:32 PM EDT ----- 1. Skin , left upper ear helix MELANOCYTIC NEVUS, COMPOUND TYPE, BASE INVOLVED 2. Skin , right lateral canthus MELANOCYTIC NEVUS, INTRADERMAL TYPE, BASE INVOLVED 3. Skin , right neck MELANOCYTIC NEVUS WITH SCAR, BASE INVOLVED, SEE DESCRIPTION 4. Skin , right upper arm MELANOCYTIC NEVUS WITH SCAR, BASE INVOLVED, SEE DESCRIPTION  1-4 are benign moles, may recur - please call patient

## 2022-03-25 NOTE — Telephone Encounter (Signed)
Patient advised of BX results .aw 

## 2022-05-05 ENCOUNTER — Ambulatory Visit: Payer: BC Managed Care – PPO | Admitting: Dermatology

## 2022-05-06 ENCOUNTER — Other Ambulatory Visit: Payer: Self-pay | Admitting: Dermatology

## 2022-05-06 DIAGNOSIS — L708 Other acne: Secondary | ICD-10-CM

## 2022-05-27 ENCOUNTER — Ambulatory Visit: Payer: BC Managed Care – PPO | Admitting: Dermatology

## 2022-10-13 ENCOUNTER — Other Ambulatory Visit: Payer: Self-pay | Admitting: Internal Medicine

## 2022-10-13 DIAGNOSIS — Z1231 Encounter for screening mammogram for malignant neoplasm of breast: Secondary | ICD-10-CM

## 2022-10-20 ENCOUNTER — Ambulatory Visit
Admission: RE | Admit: 2022-10-20 | Discharge: 2022-10-20 | Disposition: A | Discharge status: Home / Self Care | Payer: BC Managed Care – PPO | Source: Ambulatory Visit | Attending: Internal Medicine | Admitting: Internal Medicine

## 2022-10-20 DIAGNOSIS — Z1231 Encounter for screening mammogram for malignant neoplasm of breast: Secondary | ICD-10-CM | POA: Diagnosis not present

## 2023-08-30 ENCOUNTER — Other Ambulatory Visit: Payer: Self-pay | Admitting: Internal Medicine

## 2023-08-30 DIAGNOSIS — Z1231 Encounter for screening mammogram for malignant neoplasm of breast: Secondary | ICD-10-CM

## 2023-09-27 ENCOUNTER — Other Ambulatory Visit: Payer: Self-pay | Admitting: Internal Medicine

## 2023-09-27 DIAGNOSIS — Z1231 Encounter for screening mammogram for malignant neoplasm of breast: Secondary | ICD-10-CM

## 2023-10-22 ENCOUNTER — Ambulatory Visit
Admission: RE | Admit: 2023-10-22 | Discharge: 2023-10-22 | Disposition: A | Discharge status: Home / Self Care | Payer: BC Managed Care – PPO | Source: Ambulatory Visit | Attending: Internal Medicine | Admitting: Internal Medicine

## 2023-10-22 DIAGNOSIS — Z1231 Encounter for screening mammogram for malignant neoplasm of breast: Secondary | ICD-10-CM | POA: Diagnosis present

## 2024-03-10 ENCOUNTER — Encounter: Payer: Self-pay | Admitting: Internal Medicine

## 2024-03-10 DIAGNOSIS — N649 Disorder of breast, unspecified: Secondary | ICD-10-CM

## 2024-03-13 ENCOUNTER — Other Ambulatory Visit: Payer: Self-pay | Admitting: Internal Medicine

## 2024-03-13 DIAGNOSIS — N649 Disorder of breast, unspecified: Secondary | ICD-10-CM

## 2024-03-16 ENCOUNTER — Inpatient Hospital Stay
Admission: RE | Admit: 2024-03-16 | Discharge: 2024-03-16 | Disposition: A | Discharge status: Home / Self Care | Payer: Self-pay | Source: Ambulatory Visit | Attending: Internal Medicine | Admitting: Internal Medicine

## 2024-03-16 ENCOUNTER — Other Ambulatory Visit: Payer: Self-pay | Admitting: *Deleted

## 2024-03-16 DIAGNOSIS — Z1231 Encounter for screening mammogram for malignant neoplasm of breast: Secondary | ICD-10-CM

## 2024-03-22 ENCOUNTER — Ambulatory Visit
Admission: RE | Admit: 2024-03-22 | Discharge: 2024-03-22 | Discharge status: Home / Self Care | Source: Ambulatory Visit | Attending: Internal Medicine | Admitting: Internal Medicine

## 2024-03-22 ENCOUNTER — Ambulatory Visit
Admission: RE | Admit: 2024-03-22 | Discharge: 2024-03-22 | Disposition: A | Discharge status: Home / Self Care | Source: Ambulatory Visit | Attending: Internal Medicine | Admitting: Internal Medicine

## 2024-03-22 DIAGNOSIS — N649 Disorder of breast, unspecified: Secondary | ICD-10-CM

## 2024-03-22 DIAGNOSIS — R928 Other abnormal and inconclusive findings on diagnostic imaging of breast: Secondary | ICD-10-CM | POA: Diagnosis not present

## 2024-05-03 DIAGNOSIS — Z862 Personal history of diseases of the blood and blood-forming organs and certain disorders involving the immune mechanism: Secondary | ICD-10-CM | POA: Diagnosis not present

## 2024-05-03 DIAGNOSIS — Z0000 Encounter for general adult medical examination without abnormal findings: Secondary | ICD-10-CM | POA: Diagnosis not present

## 2024-05-10 DIAGNOSIS — Z131 Encounter for screening for diabetes mellitus: Secondary | ICD-10-CM | POA: Diagnosis not present

## 2024-05-10 DIAGNOSIS — G44229 Chronic tension-type headache, not intractable: Secondary | ICD-10-CM | POA: Diagnosis not present

## 2024-05-10 DIAGNOSIS — F909 Attention-deficit hyperactivity disorder, unspecified type: Secondary | ICD-10-CM | POA: Diagnosis not present

## 2024-05-10 DIAGNOSIS — Z1331 Encounter for screening for depression: Secondary | ICD-10-CM | POA: Diagnosis not present

## 2024-05-10 DIAGNOSIS — Z862 Personal history of diseases of the blood and blood-forming organs and certain disorders involving the immune mechanism: Secondary | ICD-10-CM | POA: Diagnosis not present

## 2024-05-10 DIAGNOSIS — Z0000 Encounter for general adult medical examination without abnormal findings: Secondary | ICD-10-CM | POA: Diagnosis not present

## 2024-08-22 ENCOUNTER — Ambulatory Visit: Admitting: Dermatology

## 2024-08-22 DIAGNOSIS — D489 Neoplasm of uncertain behavior, unspecified: Secondary | ICD-10-CM | POA: Diagnosis not present

## 2024-08-22 DIAGNOSIS — D2262 Melanocytic nevi of left upper limb, including shoulder: Secondary | ICD-10-CM | POA: Diagnosis not present

## 2024-08-22 DIAGNOSIS — L578 Other skin changes due to chronic exposure to nonionizing radiation: Secondary | ICD-10-CM | POA: Diagnosis not present

## 2024-08-22 DIAGNOSIS — L700 Acne vulgaris: Secondary | ICD-10-CM

## 2024-08-22 DIAGNOSIS — Z808 Family history of malignant neoplasm of other organs or systems: Secondary | ICD-10-CM

## 2024-08-22 DIAGNOSIS — Z1283 Encounter for screening for malignant neoplasm of skin: Secondary | ICD-10-CM | POA: Diagnosis not present

## 2024-08-22 DIAGNOSIS — L814 Other melanin hyperpigmentation: Secondary | ICD-10-CM

## 2024-08-22 DIAGNOSIS — W908XXA Exposure to other nonionizing radiation, initial encounter: Secondary | ICD-10-CM

## 2024-08-22 DIAGNOSIS — L821 Other seborrheic keratosis: Secondary | ICD-10-CM

## 2024-08-22 DIAGNOSIS — D225 Melanocytic nevi of trunk: Secondary | ICD-10-CM

## 2024-08-22 DIAGNOSIS — D239 Other benign neoplasm of skin, unspecified: Secondary | ICD-10-CM

## 2024-08-22 DIAGNOSIS — D1801 Hemangioma of skin and subcutaneous tissue: Secondary | ICD-10-CM

## 2024-08-22 DIAGNOSIS — D229 Melanocytic nevi, unspecified: Secondary | ICD-10-CM

## 2024-08-22 HISTORY — DX: Melanocytic nevi, unspecified: D22.9

## 2024-08-22 HISTORY — DX: Other benign neoplasm of skin, unspecified: D23.9

## 2024-08-22 MED ORDER — TAZAROTENE 0.1 % EX CREA: TOPICAL_CREAM | CUTANEOUS | 8 refills | Status: AC

## 2024-08-22 MED ORDER — TAZAROTENE 0.1 % EX CREA: TOPICAL_CREAM | CUTANEOUS | 8 refills | Status: DC

## 2024-08-22 MED ORDER — WINLEVI 1 % EX CREA: TOPICAL_CREAM | CUTANEOUS | 6 refills | Status: AC

## 2024-08-22 NOTE — Patient Instructions (Addendum)
 Biopsy Wound Care Instructions  Leave the original bandage on for 24 hours if possible.  If the bandage becomes soaked or soiled before that time, it is OK to remove it and examine the wound.  A small amount of post-operative bleeding is normal.  If excessive bleeding occurs, remove the bandage, place gauze over the site and apply continuous pressure (no peeking) over the area for 30 minutes. If this does not work, please call our clinic as soon as possible or page your doctor if it is after hours.   Once a day, cleanse the wound with soap and water. It is fine to shower. If a thick crust develops you may use a Q-tip dipped into dilute hydrogen peroxide (mix 1:1 with water) to dissolve it.  Hydrogen peroxide can slow the healing process, so use it only as needed.    After washing, apply petroleum jelly (Vaseline) or an antibiotic ointment if your doctor prescribed one for you, followed by a bandage.    For best healing, the wound should be covered with a layer of ointment at all times. If you are not able to keep the area covered with a bandage to hold the ointment in place, this may mean re-applying the ointment several times a day.  Continue this wound care until the wound has healed and is no longer open.   Itching and mild discomfort is normal during the healing process. However, if you develop pain or severe itching, please call our office.   If you have any discomfort, you can take Tylenol (acetaminophen) or ibuprofen as directed on the bottle. (Please do not take these if you have an allergy to them or cannot take them for another reason).  Some redness, tenderness and white or yellow material in the wound is normal healing.  If the area becomes very sore and red, or develops a thick yellow-green material (pus), it may be infected; please notify us .    If you have stitches, return to clinic as directed to have the stitches removed. You will continue wound care for 2-3 days after the stitches  are removed.   Wound healing continues for up to one year following surgery. It is not unusual to experience pain in the scar from time to time during the interval.  If the pain becomes severe or the scar thickens, you should notify the office.    A slight amount of redness in a scar is expected for the first six months.  After six months, the redness will fade and the scar will soften and fade.  The color difference becomes less noticeable with time.  If there are any problems, return for a post-op surgery check at your earliest convenience.  To improve the appearance of the scar, you can use silicone scar gel, cream, or sheets (such as Mederma or Serica) every night for up to one year. These are available over the counter (without a prescription).  Please call our office at [phone] for any questions or concerns.       Melanoma ABCDEs  Melanoma is the most dangerous type of skin cancer, and is the leading cause of death from skin disease.  You are more likely to develop melanoma if you: Have light-colored skin, light-colored eyes, or red or blond hair Spend a lot of time in the sun Tan regularly, either outdoors or in a tanning bed Have had blistering sunburns, especially during childhood Have a close family member who has had a melanoma Have atypical moles  or large birthmarks  Early detection of melanoma is key since treatment is typically straightforward and cure rates are extremely high if we catch it early.   The first sign of melanoma is often a change in a mole or a new dark spot.  The ABCDE system is a way of remembering the signs of melanoma.  A for asymmetry:  The two halves do not match. B for border:  The edges of the growth are irregular. C for color:  A mixture of colors are present instead of an even brown color. D for diameter:  Melanomas are usually (but not always) greater than 6mm - the size of a pencil eraser. E for evolution:  The spot keeps changing in  size, shape, and color.  Please check your skin once per month between visits. You can use a small mirror in front and a large mirror behind you to keep an eye on the back side or your body.   If you see any new or changing lesions before your next follow-up, please call to schedule a visit.  Please continue daily skin protection including broad spectrum sunscreen SPF 30+ to sun-exposed areas, reapplying every 2 hours as needed when you're outdoors.   Staying in the shade or wearing long sleeves, sun glasses (UVA+UVB protection) and wide brim hats (4-inch brim around the entire circumference of the hat) are also recommended for sun protection.     Due to recent changes in healthcare laws, you may see results of your pathology and/or laboratory studies on MyChart before the doctors have had a chance to review them. We understand that in some cases there may be results that are confusing or concerning to you. Please understand that not all results are received at the same time and often the doctors may need to interpret multiple results in order to provide you with the best plan of care or course of treatment. Therefore, we ask that you please give us  2 business days to thoroughly review all your results before contacting the office for clarification. Should we see a critical lab result, you will be contacted sooner.   If You Need Anything After Your Visit  If you have any questions or concerns for your doctor, please call our main line at [phone] and press option 4 to reach your doctor's medical assistant. If no one answers, please leave a voicemail as directed and we will return your call as soon as possible. Messages left after 4 pm will be answered the following business day.   You may also send us  a message via MyChart. We typically respond to MyChart messages within 1-2 business days.  For prescription refills, please ask your pharmacy to contact our office. Our fax number is  [phone].  If you have an urgent issue when the clinic is closed that cannot wait until the next business day, you can page your doctor at the number below.    Please note that while we do our best to be available for urgent issues outside of office hours, we are not available 24/7.   If you have an urgent issue and are unable to reach us , you may choose to seek medical care at your doctor's office, retail clinic, urgent care center, or emergency room.  If you have a medical emergency, please immediately call 911 or go to the emergency department.  Pager Numbers  - Dr. Hester: [phone]  - Dr. Jackquline: [phone]  - Dr. Claudene: [phone]   - Dr. Raymund:  [phone]  In the event of inclement weather, please call our main line at [phone] for an update on the status of any delays or closures.  Dermatology Medication Tips: Please keep the boxes that topical medications come in in order to help keep track of the instructions about where and how to use these. Pharmacies typically print the medication instructions only on the boxes and not directly on the medication tubes.   If your medication is too expensive, please contact our office at [phone] option 4 or send us  a message through MyChart.   We are unable to tell what your co-pay for medications will be in advance as this is different depending on your insurance coverage. However, we may be able to find a substitute medication at lower cost or fill out paperwork to get insurance to cover a needed medication.   If a prior authorization is required to get your medication covered by your insurance company, please allow us  1-2 business days to complete this process.  Drug prices often vary depending on where the prescription is filled and some pharmacies may offer cheaper prices.  The website www.goodrx.com contains coupons for medications through different pharmacies. The prices here do not account for what the cost  may be with help from insurance (it may be cheaper with your insurance), but the website can give you the price if you did not use any insurance.  - You can print the associated coupon and take it with your prescription to the pharmacy.  - You may also stop by our office during regular business hours and pick up a GoodRx coupon card.  - If you need your prescription sent electronically to a different pharmacy, notify our office through [facility] or by phone at [phone] option 4.     Si Usted Necesita Algo Despus de Su Visita  Tambin puede enviarnos un mensaje a travs de Clinical cytogeneticist. Por lo general respondemos a los mensajes de MyChart en el transcurso de 1 a 2 das hbiles.  Para renovar recetas, por favor pida a su farmacia que se ponga en contacto con nuestra oficina. Randi lakes de fax es [place] [phone].  Si tiene un asunto urgente cuando la clnica est cerrada y que no puede esperar hasta el siguiente da hbil, puede llamar/localizar a su doctor(a) al nmero que aparece a continuacin.   Por favor, tenga en cuenta que aunque hacemos todo lo posible para estar disponibles para asuntos urgentes fuera del horario de [place], no estamos disponibles las 24 horas del da, los 7 [address]  [address] de la [place].   Si tiene un problema urgente y no puede comunicarse con nosotros, puede optar por buscar atencin mdica  en el consultorio de su doctor(a), en una clnica privada, en un centro de atencin urgente o en una sala de emergencias.  Si tiene Engineer, drilling, por favor llame inmediatamente al 911 o vaya a la sala de emergencias.  Nmeros de bper  - Dr. Hester: [phone]  - Dra. Jackquline: [phone]  - Dr. Claudene: [phone]  - Dra. Kitts: [phone]  En caso de inclemencias del [place], por favor llame a nuestra lnea principal al [phone] para una actualizacin sobre el estado de cualquier retraso o cierre.  Consejos para la medicacin en dermatologa: Por  favor, guarde las cajas en las que vienen los medicamentos de uso tpico para ayudarle a seguir las instrucciones sobre dnde y cmo usarlos. Las farmacias generalmente imprimen las instrucciones del medicamento slo en las cajas y no directamente  en los tubos del medicamento.   Si su medicamento es muy caro, por favor, pngase en contacto con landry rieger llamando al [phone] y presione la opcin 4 o envenos un mensaje a travs de Clinical cytogeneticist.   No podemos decirle cul ser su copago por los medicamentos por adelantado ya que esto es diferente dependiendo de la cobertura de su seguro. Sin embargo, es posible que podamos encontrar un medicamento sustituto a Audiological scientist un formulario para que el seguro cubra el medicamento que se considera necesario.   Si se requiere una autorizacin previa para que su compaa de seguros malta su medicamento, por favor permtanos de 1 a 2 das hbiles para completar este proceso.  Los precios de los medicamentos varan con frecuencia dependiendo del Environmental consultant de dnde se surte la receta y alguna farmacias pueden ofrecer precios ms baratos.  El sitio web www.goodrx.com tiene cupones para medicamentos de Health and safety inspector. Los precios aqu no tienen en cuenta lo que podra costar con la ayuda del seguro (puede ser ms barato con su seguro), pero el sitio web puede darle el precio si no utiliz Tourist information centre manager.  - Puede imprimir el cupn correspondiente y llevarlo con su receta a la farmacia.  - Tambin puede pasar por nuestra oficina durante el horario de atencin regular y Education officer, museum una tarjeta de cupones de GoodRx.  - Si necesita que su receta se enve electrnicamente a una farmacia diferente, informe a nuestra oficina a travs de MyChart de [place] o por telfono llamando al [phone] y presione la opcin 4.

## 2024-08-22 NOTE — Progress Notes (Addendum)
 Follow-Up Visit   Subjective  Rita Sandoval is a 43 y.o. female who presents for the following: Skin Cancer Screening and Full Body Skin Exam Patient mother has had BCC in past  Spot on right buttock catches on clothes New spot on left hand x 1 yr Spot on left abdomen catches on clothes Spot at right temple  Tiny bumps at chest and shoulders Accutane tx but was not able to tolerate   The patient presents for Total-Body Skin Exam (TBSE) for skin cancer screening and mole check. The patient has spots, moles and lesions to be evaluated, some may be new or changing and the patient may have concern these could be cancer.    The following portions of the chart were reviewed this encounter and updated as appropriate: medications, allergies, medical history  Review of Systems:  No other skin or systemic complaints except as noted in HPI or Assessment and Plan.  Objective  Well appearing patient in no apparent distress; mood and affect are within normal limits.  A full examination was performed including scalp, head, eyes, ears, nose, lips, neck, chest, axillae, abdomen, back, buttocks, bilateral upper extremities, bilateral lower extremities, hands, feet, fingers, toes, fingernails, and toenails. All findings within normal limits unless otherwise noted below.   Relevant physical exam findings are noted in the Assessment and Plan.  left palm 2.5 mm medium brown  macule   left lower abdomen 4 mm flesh brown macule   right medial buttock 5 mm brown papule    Assessment & Plan   SKIN CANCER SCREENING PERFORMED TODAY.  ACTINIC DAMAGE - Chronic condition, secondary to cumulative UV/sun exposure - diffuse scaly erythematous macules with underlying dyspigmentation - Recommend daily broad spectrum sunscreen SPF 30+ to sun-exposed areas, reapply every 2 hours as needed.  - Staying in the shade or wearing long sleeves, sun glasses (UVA+UVB protection) and wide brim hats (4-inch brim  around the entire circumference of the hat) are also recommended for sun protection.  - Call for new or changing lesions.  LENTIGINES, SEBORRHEIC KERATOSES, HEMANGIOMAS - Benign normal skin lesions - tan macule R temple- lentigo - Benign-appearing - Call for any changes  MELANOCYTIC NEVI. - Right lower back 4 mm med dark brown macule - Left post shoulder 4 x 3 mm med dark brown macule  - Tan and dark-brown and/or pink-flesh-colored symmetric macules and papules - Benign appearing on exam today - Observation - Call clinic for new or changing moles - Recommend daily use of broad spectrum spf 30+ sunscreen to sun-exposed areas.   ACNE VULGARIS Exam: Cystic papules on upper jaw, neck and perioral  Closed comedones on upper back   Chronic and persistent condition with duration or expected duration over one year. Condition is bothersome/symptomatic for patient. Currently flared.  Treatment Plan: Start Winlevi  cream apply bid for acne  Rx sent to [facility] tazarotene  0.1 % cream apply pea sized amount to face, chest, shoulders and back qhs as tolerated   Topical retinoid medications like tretinoin/Retin-A, adapalene/Differin, tazarotene /Fabior , and Epiduo/Epiduo Forte can cause dryness and irritation when first started. Only apply a pea-sized amount to the entire affected area. Avoid applying it around the eyes, edges of mouth and creases at the nose. If you experience irritation, use a good moisturizer first and/or apply the medicine less often. If you are doing well with the medicine, you can increase how often you use it until you are applying every night. Be careful with sun  protection while using this medication as it can make you sensitive to the sun. This medicine should not be used by pregnant women.   FAMILY HISTORY OF SKIN CANCER What type(s):BCC Who affected: mother  NEOPLASM OF UNCERTAIN BEHAVIOR (3) left palm Epidermal / dermal shaving  Lesion diameter (cm):   0.3 Informed consent: discussed and consent obtained   Patient was prepped and draped in usual sterile fashion: Area prepped with alcohol. Anesthesia: the lesion was anesthetized in a standard fashion   Anesthetic:  1% lidocaine w/ epinephrine 1-100,000 buffered w/ 8.4% NaHCO3 Instrument used: flexible razor blade   Hemostasis achieved with: pressure, aluminum chloride and electrodesiccation   Outcome: patient tolerated procedure well   Post-procedure details: wound care instructions given   Post-procedure details comment:  Ointment and small bandage applied.   Specimen 1 - Surgical pathology Differential Diagnosis: nevus r/o dysplasia   Check Margins: yes left lower abdomen Epidermal / dermal shaving  Lesion diameter (cm):  0.4 Informed consent: discussed and consent obtained   Patient was prepped and draped in usual sterile fashion: Area prepped with alcohol. Anesthesia: the lesion was anesthetized in a standard fashion   Anesthetic:  1% lidocaine w/ epinephrine 1-100,000 buffered w/ 8.4% NaHCO3 Instrument used: flexible razor blade   Hemostasis achieved with: pressure, aluminum chloride and electrodesiccation   Outcome: patient tolerated procedure well   Post-procedure details: wound care instructions given   Post-procedure details comment:  Ointment and small bandage applied.   Specimen 2 - Surgical pathology Differential Diagnosis: irritated nevus r/o dysplasia   Check Margins: yes right medial buttock Epidermal / dermal shaving  Lesion diameter (cm):  0.5 Informed consent: discussed and consent obtained   Patient was prepped and draped in usual sterile fashion: Area prepped with alcohol. Anesthesia: the lesion was anesthetized in a standard fashion   Anesthetic:  1% lidocaine w/ epinephrine 1-100,000 buffered w/ 8.4% NaHCO3 Instrument used: flexible razor blade   Hemostasis achieved with: pressure, aluminum chloride and electrodesiccation   Outcome: patient tolerated  procedure well   Post-procedure details: wound care instructions given   Post-procedure details comment:  Ointment and small bandage applied.   Specimen 3 - Surgical pathology Differential Diagnosis: irritated nevus r/o dysplasia   Check Margins: yes Nevus r/o dysplasia for left palm  Irritated nevus r/o dysplasia abd/bittock ACNE VULGARIS   Related Medications Clascoterone  (WINLEVI ) 1 % CREA Apply topically to face bid for acne tazarotene  (AVAGE ) 0.1 % cream Apply pea sized amount to shoulder chest and back qhs for acne as tolerated SKIN CANCER SCREENING   ACTINIC SKIN DAMAGE   LENTIGO   SEBORRHEIC KERATOSIS   HEMANGIOMA OF SKIN   NEVUS   FAMILY HISTORY OF SKIN CANCER   Return in about 1 year (around 08/22/2025) for TBSE.  I, Eleanor Blush, CMA, am acting as scribe for Rexene Rattler, MD.   Documentation: I have reviewed the above documentation for accuracy and completeness, and I agree with the above.  Rexene Rattler, MD

## 2024-08-28 ENCOUNTER — Ambulatory Visit: Payer: Self-pay | Admitting: Dermatology

## 2024-08-28 ENCOUNTER — Telehealth: Payer: Self-pay

## 2024-08-28 ENCOUNTER — Encounter: Payer: Self-pay | Admitting: Dermatology

## 2024-08-28 LAB — SURGICAL PATHOLOGY

## 2024-08-28 NOTE — Telephone Encounter (Signed)
 Patient advised of biopsy results. Excision and punch excision for #1 and #3 scheduled for 09/25/2024 at 1:30 PM.

## 2024-08-28 NOTE — Telephone Encounter (Signed)
-----   Message from Rexene Rattler sent at 08/28/2024  5:22 PM EST ----- 1. Skin, left palm :       DYSPLASTIC COMPOUND NEVUS WITH MODERATE ATYPIA, DEEP MARGIN INVOLVED  2. Skin, left lower abdomen :       MELANOCYTIC NEVUS, INTRADERMAL TYPE, BASE INVOLVED  3. Skin, right medial buttock :       ATYPICAL COMBINED MELANOCYTIC NEVUS, BASE INVOLVED, SEE DESCRIPTION  Moderately atypical mole, recommend punch excision to remove entire lesion Benign mole Atypical mole, needs excision to remove entire lesion  - please call patient ----- Message ----- From: Interface, Lab In [facility] [place] Sent: 08/28/2024   4:21 PM EST To: Rexene Rattler, MD

## 2024-08-28 NOTE — Telephone Encounter (Signed)
 Left message for patient to call for biopsy results.  #1 - Moderately atypical mole, recommend punch excision to remove entire lesion (can be done at surgery appt for #3) #2 - left lower abdomen - Benign mole #3 - right medial buttock Atypical mole, needs excision to remove entire lesion

## 2024-08-30 ENCOUNTER — Telehealth: Payer: Self-pay

## 2024-08-30 NOTE — Telephone Encounter (Signed)
Patient advised of information per Dr. Stewart. aw 

## 2024-08-30 NOTE — Telephone Encounter (Signed)
 Patient called Rita Sandoval back about moving her appt back to 12/29 at 1:30. Patient scheduled.  Patient is also asking can she have something for her nerves the day of procedure? She states from the biopsy and having a lot of pain she is anxious.

## 2024-09-25 ENCOUNTER — Ambulatory Visit: Admitting: Dermatology

## 2024-09-25 ENCOUNTER — Encounter: Payer: Self-pay | Admitting: Dermatology

## 2024-09-25 ENCOUNTER — Encounter: Admitting: Dermatology

## 2024-09-25 DIAGNOSIS — D225 Melanocytic nevi of trunk: Secondary | ICD-10-CM

## 2024-09-25 DIAGNOSIS — D239 Other benign neoplasm of skin, unspecified: Secondary | ICD-10-CM

## 2024-09-25 DIAGNOSIS — D2262 Melanocytic nevi of left upper limb, including shoulder: Secondary | ICD-10-CM

## 2024-09-25 DIAGNOSIS — D229 Melanocytic nevi, unspecified: Secondary | ICD-10-CM

## 2024-09-25 MED ORDER — MUPIROCIN 2 % EX OINT: TOPICAL_OINTMENT | CUTANEOUS | 1 refills | Status: AC

## 2024-09-25 NOTE — Progress Notes (Signed)
 mupirocin  Follow-Up Visit   Subjective  Rita Sandoval is a 43 y.o. female who presents for the following: Atypical combined melanocytic nevus, biopsy proven, of the right medial buttock. Punch excision today to the left palm for dysplastic nevus with moderate atypia, deep margin involved.   The following portions of the chart were reviewed this encounter and updated as appropriate: medications, allergies, medical history  Review of Systems:  No other skin or systemic complaints except as noted in HPI or Assessment and Plan.  Objective  Well appearing patient in no apparent distress; mood and affect are within normal limits.  A focused examination was performed of the following areas: buttock Relevant physical exam findings are noted in the Assessment and Plan.  right medial buttock Pink biopsy site.  left palm Pink biopsy site.   Assessment & Plan   ATYPICAL NEVUS right medial buttock Biopsy proven Atypical combined melanocytic nevus, base involved. - Skin excision - right medial buttock  Excision method:  elliptical Lesion length (cm):  0.7 Lesion width (cm):  0.7 Margin per side (cm):  0.2 Total excision diameter (cm):  1.1 Informed consent: discussed and consent obtained   Timeout: patient name, date of birth, surgical site, and procedure verified   Procedure prep:  Patient was prepped and draped in usual sterile fashion Prep type:  Povidone-iodine Anesthesia: the lesion was anesthetized in a standard fashion   Anesthetic:  1% lidocaine w/ epinephrine 1-100,000 buffered w/ 8.4% NaHCO3 Instrument used: #15 blade   Hemostasis achieved with: pressure and electrodesiccation   Outcome: patient tolerated procedure well with no complications   Additional details:  Tagged lateral 3 o'clock  - Skin repair - right medial buttock Complexity:  Intermediate Final length (cm):  3 Informed consent: discussed and consent obtained   Reason for type of repair: reduce tension  to allow closure, reduce the risk of dehiscence, infection, and necrosis, reduce subcutaneous dead space and avoid a hematoma, preserve normal anatomical and functional relationships and enhance both functionality and cosmetic results   Undermining: edges undermined   Subcutaneous layers (deep stitches):  Suture size:  4-0 Suture type: Vicryl (polyglactin 910)   Stitches:  Buried vertical mattress Fine/surface layer approximation (top stitches):  Suture size:  4-0 Suture type: nylon   Stitches: simple interrupted   Suture removal (days):  7 Hemostasis achieved with: suture Outcome: patient tolerated procedure well with no complications   Post-procedure details: sterile dressing applied and wound care instructions given   Dressing type: pressure dressing (mupirocin)    Specimen 1 - Surgical pathology Differential Diagnosis: Atypical combined melanocytic nevus Check Margins: Yes [id number] Tagged lateral 3 o'clock DYSPLASTIC NEVUS left palm 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  - Skin excision - left palm  Excision method:  punch Lesion length (cm):  0.3 Lesion width (cm):  0.3 Margin per side (cm):  0 Total excision diameter (cm):  0.4 Informed consent: discussed and consent obtained   Timeout: patient name, date of birth, surgical site, and procedure verified   Procedure prep:  Patient was prepped and draped in usual sterile fashion Prep type:  Isopropyl alcohol Anesthesia: the lesion was anesthetized in a standard fashion   Anesthetic:  1% lidocaine w/ epinephrine 1-100,000 buffered w/ 8.4% NaHCO3 Instrument used comment:  4mm punch Hemostasis achieved with: pressure   Outcome: patient tolerated procedure well with no complications    - Skin repair - left palm Complexity:  Simple Final length (cm):  0.4  Informed consent: discussed and consent obtained   Undermining: edges could be approximated without difficulty    Fine/surface layer approximation (top stitches):  Suture size:  4-0 Suture type: nylon   Stitches: simple interrupted   Suture removal (days):  8 Hemostasis achieved with: suture and pressure Outcome: patient tolerated procedure well with no complications   Post-procedure details: sterile dressing applied and wound care instructions given   Dressing type: pressure dressing (mupirocin)    Specimen 2 - Surgical pathology Differential Diagnosis: Dysplastic nevus with moderate atypia, deep margin involved Check Margins: Yes [phone]   Return in about 8 days (around 10/03/2024) for suture removal.  I, Andrea Kerns, CMA, am acting as scribe for Rexene Rattler, MD .   Documentation: I have reviewed the above documentation for accuracy and completeness, and I agree with the above.  Rexene Rattler, MD

## 2024-09-25 NOTE — Patient Instructions (Signed)

## 2024-09-29 LAB — SURGICAL PATHOLOGY

## 2024-10-02 ENCOUNTER — Ambulatory Visit: Payer: Self-pay | Admitting: Dermatology

## 2024-10-03 ENCOUNTER — Ambulatory Visit: Admitting: Dermatology

## 2024-10-03 ENCOUNTER — Encounter: Payer: Self-pay | Admitting: Dermatology

## 2024-10-03 DIAGNOSIS — D229 Melanocytic nevi, unspecified: Secondary | ICD-10-CM

## 2024-10-03 DIAGNOSIS — Z48817 Encounter for surgical aftercare following surgery on the skin and subcutaneous tissue: Secondary | ICD-10-CM

## 2024-10-03 NOTE — Progress Notes (Signed)
" ° °  Follow-Up Visit   Subjective  Rita Sandoval is a 44 y.o. female who presents for the following: Suture removal right medial buttock and left palm. Left palm is painful, per patient, and feels like it is pulling.  Pathology showed residual atypical combined melanocytic nevus, margins free at right buttock and no residual dysplastic nevus, margins free at left palm.   The following portions of the chart were reviewed this encounter and updated as appropriate: medications, allergies, medical history  Review of Systems:  No other skin or systemic complaints except as noted in HPI or Assessment and Plan.  Objective  Well appearing patient in no apparent distress; mood and affect are within normal limits.  Areas Examined: Buttocks, left palm  Relevant physical exam findings are noted in the Assessment and Plan.    Assessment & Plan    Encounter for Removal of Sutures - Incision site is clean, dry and intact. - Wound cleansed, sutures removed, wound cleansed and steri strips applied.  - Discussed pathology results showing residual atypical combined melanocytic nevus, margins free - Patient advised to keep steri-strips dry until they fall off. - Scars remodel for a full year. - Once steri-strips fall off, patient can apply over-the-counter silicone scar cream once to twice a day to help with scar remodeling if desired. - Patient advised to call with any concerns or if they notice any new or changing lesions.  Patient will return next week for suture removal at left palm.  Return Monday 10/09/24, for Suture removal per Dr. Jackquline .  I, Jill Parcell, CMA, am acting as scribe for Rexene Jackquline, MD.   Documentation: I have reviewed the above documentation for accuracy and completeness, and I agree with the above.  Rexene Jackquline, MD  "

## 2024-10-03 NOTE — Patient Instructions (Signed)

## 2024-10-04 ENCOUNTER — Encounter: Admitting: Dermatology

## 2024-10-09 ENCOUNTER — Encounter: Payer: Self-pay | Admitting: Dermatology

## 2024-10-09 ENCOUNTER — Ambulatory Visit (INDEPENDENT_AMBULATORY_CARE_PROVIDER_SITE_OTHER): Admitting: Dermatology

## 2024-10-09 DIAGNOSIS — D239 Other benign neoplasm of skin, unspecified: Secondary | ICD-10-CM

## 2024-10-09 DIAGNOSIS — Z48817 Encounter for surgical aftercare following surgery on the skin and subcutaneous tissue: Secondary | ICD-10-CM

## 2024-10-09 NOTE — Progress Notes (Signed)
" ° °  Follow-Up Visit   Subjective  Rita Sandoval is a 44 y.o. female who presents for the following: Suture removal  Pathology showed no residual dysplastic nevus, margins free at left palm   The following portions of the chart were reviewed this encounter and updated as appropriate: medications, allergies, medical history  Review of Systems:  No other skin or systemic complaints except as noted in HPI or Assessment and Plan.  Objective  Well appearing patient in no apparent distress; mood and affect are within normal limits.  Areas Examined: Hand   Relevant physical exam findings are noted in the Assessment and Plan.    Assessment & Plan    Encounter for Removal of Sutures - Incision site is clean, dry and intact. - Wound cleansed, sutures removed, wound cleansed and steri strips applied.  - Discussed pathology results showing no residual dysplastic nevus, margins free at left palm  - Patient advised to keep steri-strips dry until they fall off. - Scars remodel for a full year. - Once steri-strips fall off, patient can apply over-the-counter silicone scar cream once to twice a day to help with scar remodeling if desired. - Patient advised to call with any concerns or if they notice any new or changing lesions.  Return as scheduled for TBSE.  I, Kate Fought, CMA, am acting as scribe for Rexene Rattler, MD.   Documentation: I have reviewed the above documentation for accuracy and completeness, and I agree with the above.  Rexene Rattler, MD  "

## 2024-10-18 ENCOUNTER — Other Ambulatory Visit: Payer: Self-pay | Admitting: Internal Medicine

## 2024-10-18 DIAGNOSIS — Z1231 Encounter for screening mammogram for malignant neoplasm of breast: Secondary | ICD-10-CM

## 2024-11-23 ENCOUNTER — Encounter

## 2025-09-04 ENCOUNTER — Encounter: Admitting: Dermatology
